# Patient Record
Sex: Female | Born: 1940 | Race: White | Hispanic: Yes | Marital: Married | Smoking: Never smoker
Health system: Southern US, Community
[De-identification: ages and names within clinical notes are randomized; demographics above are authoritative.]

## PROBLEM LIST (undated history)

## (undated) DIAGNOSIS — J45909 Unspecified asthma, uncomplicated: Secondary | ICD-10-CM

## (undated) HISTORY — DX: Unspecified asthma, uncomplicated: J45.909

## (undated) HISTORY — PX: BREAST EXCISIONAL BIOPSY: SUR124

---

## 2014-08-22 ENCOUNTER — Other Ambulatory Visit: Payer: Self-pay | Admitting: Cardiology

## 2014-08-22 ENCOUNTER — Ambulatory Visit
Admission: RE | Admit: 2014-08-22 | Discharge: 2014-08-22 | Disposition: A | Discharge status: Home / Self Care | Payer: Commercial Managed Care - HMO | Source: Ambulatory Visit | Attending: Cardiology | Admitting: Cardiology

## 2014-08-22 DIAGNOSIS — R0602 Shortness of breath: Secondary | ICD-10-CM

## 2015-06-14 ENCOUNTER — Other Ambulatory Visit: Payer: Self-pay | Admitting: Family Medicine

## 2015-06-14 DIAGNOSIS — R0989 Other specified symptoms and signs involving the circulatory and respiratory systems: Secondary | ICD-10-CM

## 2015-06-20 ENCOUNTER — Ambulatory Visit
Admission: RE | Admit: 2015-06-20 | Discharge: 2015-06-20 | Disposition: A | Discharge status: Home / Self Care | Payer: Commercial Managed Care - HMO | Source: Ambulatory Visit | Attending: Family Medicine | Admitting: Family Medicine

## 2015-06-20 DIAGNOSIS — R0989 Other specified symptoms and signs involving the circulatory and respiratory systems: Secondary | ICD-10-CM

## 2015-12-23 ENCOUNTER — Other Ambulatory Visit: Payer: Self-pay | Admitting: Family Medicine

## 2015-12-23 DIAGNOSIS — Z1231 Encounter for screening mammogram for malignant neoplasm of breast: Secondary | ICD-10-CM

## 2016-01-09 ENCOUNTER — Ambulatory Visit
Admission: RE | Admit: 2016-01-09 | Discharge: 2016-01-09 | Disposition: A | Discharge status: Home / Self Care | Payer: Commercial Managed Care - HMO | Source: Ambulatory Visit | Attending: Family Medicine | Admitting: Family Medicine

## 2016-01-09 DIAGNOSIS — Z1231 Encounter for screening mammogram for malignant neoplasm of breast: Secondary | ICD-10-CM

## 2016-01-10 ENCOUNTER — Other Ambulatory Visit: Payer: Self-pay | Admitting: Family Medicine

## 2016-01-10 DIAGNOSIS — D249 Benign neoplasm of unspecified breast: Secondary | ICD-10-CM

## 2016-01-15 ENCOUNTER — Ambulatory Visit
Admission: RE | Admit: 2016-01-15 | Discharge: 2016-01-15 | Disposition: A | Discharge status: Home / Self Care | Payer: Commercial Managed Care - HMO | Source: Ambulatory Visit | Attending: Family Medicine | Admitting: Family Medicine

## 2016-01-15 DIAGNOSIS — D249 Benign neoplasm of unspecified breast: Secondary | ICD-10-CM

## 2016-06-24 ENCOUNTER — Other Ambulatory Visit: Payer: Self-pay | Admitting: Family Medicine

## 2016-06-24 DIAGNOSIS — I739 Peripheral vascular disease, unspecified: Principal | ICD-10-CM

## 2016-06-24 DIAGNOSIS — I779 Disorder of arteries and arterioles, unspecified: Secondary | ICD-10-CM

## 2016-07-02 ENCOUNTER — Ambulatory Visit
Admission: RE | Admit: 2016-07-02 | Discharge: 2016-07-02 | Disposition: A | Discharge status: Home / Self Care | Payer: Commercial Managed Care - HMO | Source: Ambulatory Visit | Attending: Family Medicine | Admitting: Family Medicine

## 2016-07-02 DIAGNOSIS — I739 Peripheral vascular disease, unspecified: Principal | ICD-10-CM

## 2016-07-02 DIAGNOSIS — I779 Disorder of arteries and arterioles, unspecified: Secondary | ICD-10-CM

## 2016-12-28 DIAGNOSIS — I779 Disorder of arteries and arterioles, unspecified: Secondary | ICD-10-CM | POA: Diagnosis not present

## 2016-12-28 DIAGNOSIS — I129 Hypertensive chronic kidney disease with stage 1 through stage 4 chronic kidney disease, or unspecified chronic kidney disease: Secondary | ICD-10-CM | POA: Diagnosis not present

## 2016-12-28 DIAGNOSIS — J45909 Unspecified asthma, uncomplicated: Secondary | ICD-10-CM | POA: Diagnosis not present

## 2016-12-28 DIAGNOSIS — F322 Major depressive disorder, single episode, severe without psychotic features: Secondary | ICD-10-CM | POA: Diagnosis not present

## 2016-12-28 DIAGNOSIS — N183 Chronic kidney disease, stage 3 (moderate): Secondary | ICD-10-CM | POA: Diagnosis not present

## 2016-12-28 DIAGNOSIS — M1990 Unspecified osteoarthritis, unspecified site: Secondary | ICD-10-CM | POA: Diagnosis not present

## 2016-12-28 DIAGNOSIS — E1129 Type 2 diabetes mellitus with other diabetic kidney complication: Secondary | ICD-10-CM | POA: Diagnosis not present

## 2016-12-28 DIAGNOSIS — I208 Other forms of angina pectoris: Secondary | ICD-10-CM | POA: Diagnosis not present

## 2016-12-28 DIAGNOSIS — I2510 Atherosclerotic heart disease of native coronary artery without angina pectoris: Secondary | ICD-10-CM | POA: Diagnosis not present

## 2016-12-28 DIAGNOSIS — E7800 Pure hypercholesterolemia, unspecified: Secondary | ICD-10-CM | POA: Diagnosis not present

## 2016-12-28 DIAGNOSIS — R002 Palpitations: Secondary | ICD-10-CM | POA: Diagnosis not present

## 2016-12-28 DIAGNOSIS — Z23 Encounter for immunization: Secondary | ICD-10-CM | POA: Diagnosis not present

## 2017-01-20 ENCOUNTER — Encounter (INDEPENDENT_AMBULATORY_CARE_PROVIDER_SITE_OTHER): Payer: Self-pay

## 2017-01-20 ENCOUNTER — Encounter: Payer: Self-pay | Admitting: Allergy & Immunology

## 2017-01-20 ENCOUNTER — Ambulatory Visit (INDEPENDENT_AMBULATORY_CARE_PROVIDER_SITE_OTHER): Payer: PPO | Admitting: Allergy & Immunology

## 2017-01-20 VITALS — BP 132/70 | HR 80 | Temp 98.3°F | Resp 14 | Ht 58.5 in | Wt 151.0 lb

## 2017-01-20 DIAGNOSIS — J4540 Moderate persistent asthma, uncomplicated: Secondary | ICD-10-CM | POA: Diagnosis not present

## 2017-01-20 DIAGNOSIS — J3089 Other allergic rhinitis: Secondary | ICD-10-CM

## 2017-01-20 DIAGNOSIS — J310 Chronic rhinitis: Secondary | ICD-10-CM

## 2017-01-20 MED ORDER — BUDESONIDE-FORMOTEROL FUMARATE 160-4.5 MCG/ACT IN AERO: 2.0000 | INHALATION_SPRAY | Freq: Two times a day (BID) | RESPIRATORY_TRACT | 5 refills | Status: AC

## 2017-01-20 MED ORDER — AZELASTINE-FLUTICASONE 137-50 MCG/ACT NA SUSP: NASAL | 5 refills | Status: AC

## 2017-01-20 MED ORDER — EPINEPHRINE 0.3 MG/0.3ML IJ SOAJ: 0.3000 mg | Freq: Once | INTRAMUSCULAR | 1 refills | Status: AC

## 2017-01-20 MED ORDER — ALBUTEROL SULFATE HFA 108 (90 BASE) MCG/ACT IN AERS: 2.0000 | INHALATION_SPRAY | RESPIRATORY_TRACT | 1 refills | Status: AC | PRN

## 2017-01-20 MED ORDER — LEVOCETIRIZINE DIHYDROCHLORIDE 5 MG PO TABS: 5.0000 mg | ORAL_TABLET | Freq: Every evening | ORAL | 5 refills | Status: AC

## 2017-01-20 NOTE — Patient Instructions (Addendum)
1. Moderate persistent asthma, uncomplicated - Lung testing today was normal.  - Since you're having continued coughing despite albuterol, I recommended starting a daily inhaled medication. -  we will start Symbicort 160/4.5 two puffs in the morning and two puffs at night + Singulair 10mg  daily - Daily controller medication(s): Symbicort 160/4.5 two puffs in the morning and two puffs at night - Rescue medications: ProAir 4 puffs every 4-6 hours as needed - Asthma control goals:  * Full participation in all desired activities (may need albuterol before activity) * Albuterol use two time or less a week on average (not counting use with activity) * Cough interfering with sleep two time or less a month * Oral steroids no more than once a year * No hospitalizations  2. Chronic rhinitis - Testing today showed: positives to grasses, weeds, ragweed, molds, cat, dust mite, roach,  - Avoidance measures discussed.  - Stop Flonase and start Dymista 2 sprays per nostril 1-2 times daily. - Add Xyzal 5mg  daily.  - Call your insurance company to make sure that you are OK with the copays that would be required. - If you are fine with rhe copays, give [place] a call back and we can mix the allergy shots.  3. Return in about 3 months (around 04/19/2017).  Please inform us of any Emergency Department visits, hospitalizations, or changes in symptoms. Call us before going to the ED for breathing or allergy symptoms since we might be able to fit you in for a sick visit. Feel free to contact us anytime with any questions, problems, or concerns.  It was a pleasure to meet you today! Best wishes in the [place] Year!   Websites that have reliable patient information: 1. American Academy of Asthma, Allergy, and Immunology: www.aaaai.org 2. Food Allergy Research and Education (FARE): foodallergy.org 3. Mothers of Asthmatics: http://www.asthmacommunitynetwork.org 4. American College of Allergy, Asthma, and Immunology:  www.acaai.org  Reducing Pollen Exposure  The American Academy of Allergy, Asthma and Immunology suggests the following steps to reduce your exposure to pollen during allergy seasons.    1. Do not hang sheets or clothing out to dry; pollen may collect on these items. 2. Do not mow lawns or spend time around freshly cut grass; mowing stirs up pollen. 3. Keep windows closed at night.  Keep car windows closed while driving. 4. Minimize morning activities outdoors, a time when pollen counts are usually at their highest. 5. Stay indoors as much as possible when pollen counts or humidity is high and on windy days when pollen tends to remain in the air longer. 6. Use air conditioning when possible.  Many air conditioners have filters that trap the pollen spores. 7. Use a HEPA room air filter to remove pollen form the indoor air you breathe.  Control of Mold Allergen  Mold and fungi can grow on a variety of surfaces provided certain temperature and moisture conditions exist.  Outdoor molds grow on plants, decaying vegetation and soil.  The major outdoor mold, Alternaria and Cladosporium, are found in very high numbers during hot and dry conditions.  Generally, a late Summer - Fall peak is seen for common outdoor fungal spores.  Rain will temporarily lower outdoor mold spore count, but counts rise rapidly when the rainy period ends.  The most important indoor molds are Aspergillus and Penicillium.  Dark, humid and poorly ventilated basements are ideal sites for mold growth.  The next most common sites of mold growth are the bathroom and the kitchen.  Outdoor [employer] 1. Use air conditioning and keep windows closed 2. Avoid exposure to decaying vegetation. 3. Avoid leaf raking. 4. Avoid grain handling. 5. Consider wearing a face mask if working in moldy areas.  Indoor Mold Control 1. Maintain humidity below 50%. 2. Clean washable surfaces with 5% bleach solution. 3. Remove sources e.g.  contaminated carpets.  Control of Dog or Cat Allergen  Avoidance is the best way to manage a dog or cat allergy. If you have a dog or cat and are allergic to dog or cats, consider removing the dog or cat from the home. If you have a dog or cat but don't want to find it a new home, or if your family wants a pet even though someone in the household is allergic, here are some strategies that may help keep symptoms at bay:  1. Keep the pet out of your bedroom and restrict it to only a few rooms. Be advised that keeping the dog or cat in only one room will not limit the allergens to that room. 2. Don't pet, hug or kiss the dog or cat; if you do, wash your hands with soap and water. 3. High-efficiency particulate air (HEPA) cleaners run continuously in a bedroom or living room can reduce allergen levels over time. 4. Regular use of a high-efficiency vacuum cleaner or a central vacuum can reduce allergen levels. 5. Giving your dog or cat a bath at least once a week can reduce airborne allergen.  Control of House Dust Mite Allergen    House dust mites play a major role in allergic asthma and rhinitis.  They occur in environments with high humidity wherever human skin, the food for dust mites is found. High levels have been detected in dust obtained from mattresses, pillows, carpets, upholstered furniture, bed covers, clothes and soft toys.  The principal allergen of the house dust mite is found in its feces.  A gram of dust may contain 1,000 mites and 250,000 fecal particles.  Mite antigen is easily measured in the air during house cleaning activities.    1. Encase mattresses, including the box spring, and pillow, in an air tight cover.  Seal the zipper end of the encased mattresses with wide adhesive tape. 2. Wash the bedding in water of 130 degrees Farenheit weekly.  Avoid cotton comforters/quilts and flannel bedding: the most ideal bed covering is the dacron comforter. 3. Remove all upholstered  furniture from the bedroom. 4. Remove carpets, carpet padding, rugs, and non-washable window drapes from the bedroom.  Wash drapes weekly or use plastic window coverings. 5. Remove all non-washable stuffed toys from the bedroom.  Wash stuffed toys weekly. 6. Have the room cleaned frequently with a vacuum cleaner and a damp dust-mop.  The patient should not be in a room which is being cleaned and should wait 1 hour after cleaning before going into the room. 7. Close and seal all heating outlets in the bedroom.  Otherwise, the room will become filled with dust-laden air.  An electric heater can be used to heat the room. 8. Reduce indoor humidity to less than 50%.  Do not use a humidifier.  Control of Cockroach Allergen  Cockroach allergen has been identified as an important cause of acute attacks of asthma, especially in urban settings.  There are fifty-five species of cockroach that exist in the [place], however only three, the [place], [place] species produce allergen that can affect patients with Asthma.  Allergens can be obtained  from fecal particles, egg casings and secretions from cockroaches.    1. Remove food sources. 2. Reduce access to water. 3. Seal access and entry points. 4. Spray runways with 0.5-1% Diazinon or Chlorpyrifos 5. Blow boric acid power under stoves and refrigerator. 6. Place bait stations (hydramethylnon) at feeding sites.

## 2017-01-20 NOTE — Progress Notes (Signed)
NEW PATIENT  Date of Service/Encounter:  01/20/17  Referring provider: Simona Huh, MD   Assessment:   Moderate persistent asthma, uncomplicated  Chronic nonseasonal allergic rhinitis   Asthma Reportables:  Severity: moderate persistent  Risk: low Control: not well controlled  Seasonal Influenza Vaccine: yes    Plan/Recommendations:   1. Moderate persistent asthma, uncomplicated - Lung testing today was normal.  - Since you're having continued coughing despite albuterol, I recommended starting a daily inhaled medication. -  we will start Symbicort 160/4.5 two puffs in the morning and two puffs at night + Singulair 80m daily - Daily controller medication(s): Symbicort 160/4.5 two puffs in the morning and two puffs at night - Rescue medications: ProAir 4 puffs every 4-6 hours as needed - Asthma control goals:  * Full participation in all desired activities (may need albuterol before activity) * Albuterol use two time or less a week on average (not counting use with activity) * Cough interfering with sleep two time or less a month * Oral steroids no more than once a year * No hospitalizations  2. Chronic rhinitis - Testing today showed: positives to grasses, mold, cat - Avoidance measures discussed.  - Stop Flonase and start Dymista 2 sprays per nostril 1-2 times daily. - Add Xyzal 52mdaily.  - We can consider allergy shots if there is no improvement in this regimen.   3. Return in about 3 months (around 04/19/2017).   Subjective:   Megan Bright a [age].o. female presenting today for evaluation of  Chief Complaint  Patient presents with  . Allergies  . Asthma  . Cough  . Nasal Congestion    Megan Battagliaas a history of the following: There are no active problems to display for this patient.   History obtained from: chart review and patient via an interpreter.  Megan Bright was referred by EHSimona HuhMD.     Megan Bright  a [age].o. female presenting for cough and congestion. The cough has been going on for two months. The cough is productive and it is white. She has not tried taking anything for the phlegm. She was using albuterol which did not help. She did not get a CXR. She did have a fever initially but it cleared up. She did not take any prednisonefor this current illness. She has had a difficult time catching her breath since that time. Physical activity makes it worse. She has never been a smoker and has had no recent new exposures. She does carry the diagnosis of asthma. She is on Singulair and has been on that for a "long time". She did go to the hospital 4-5 years ago. She estimates that she needed prednisone around four years ago at the last time, otherwise, she has not needed prednisone in quite some time. It does not appear that she has ever been on any inhaled corticosteroids for her symptoms area   She is having a lot of nasal congestion. This has been ongoing for a period of two months. She has never been diagnosed with allergies previously. She does have itchy watery eyes. She has never taken antihistamines but she has used Flonase in the past. This does seem to help. She has never been allergy tested in the past.  She does have a history of high blood pressure but otherwise no chromic medical problems. [place]  Otherwise, there is no history of other atopic diseases, including  drug allergies, food allergies, stinging insect allergies, or urticaria. There is no significant infectious history. She does not remember the last time that she needed antibiotics. Vaccinations are up to date.     Past Medical History: There are no active problems to display for this patient.   Medication List:  Allergies as of 01/20/2017   Not on File     Medication List       Accurate as of 01/20/17 12:30 PM. Always  use your most recent med list.          albuterol 108 (90 Base) MCG/ACT inhaler Commonly known as:  PROVENTIL HFA;VENTOLIN HFA Inhale 2 puffs into the lungs every 4 (four) hours as needed for wheezing or shortness of breath.   atorvastatin 40 MG tablet Commonly known as:  LIPITOR Take 40 mg by mouth daily.   carvedilol 12.5 MG tablet Commonly known as:  COREG Take 12.5 mg by mouth 2 (two) times daily with a meal.   montelukast 10 MG tablet Commonly known as:  SINGULAIR Take 10 mg by mouth at bedtime.   sertraline 50 MG tablet Commonly known as:  ZOLOFT Take 50 mg by mouth daily.   valsartan-hydrochlorothiazide 320-25 MG tablet Commonly known as:  DIOVAN-HCT Take 1 tablet by mouth daily.       Birth History: non-contributory. Born at term without complications.   Developmental History: Megan Bright has met all milestones on time. She has required no speech therapy, occupational therapy, or physical therapy.   Past Surgical History: History reviewed. No pertinent surgical history.   Family History: Family History  Problem Relation Age of Onset  . Allergic rhinitis Neg Hx   . Angioedema Neg Hx   . Asthma Neg Hx   . Eczema Neg Hx   . Immunodeficiency Neg Hx   . Urticaria Neg Hx      Social History: Megan Bright lives at home with her husband of 14 years. 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 Her husband worked as a Games developer. She lives in a house. There is no mildew exposure. She has electric heat and central cooling. There are cats outside of the home. No dust mite covers on the bedding. There is no tobacco exposure.   Review of Systems: a 14-point review of systems is pertinent for what is mentioned in HPI.  Otherwise, all other systems were negative. Constitutional: negative other than that listed in the HPI Eyes: negative other than that listed in the HPI Ears, nose, mouth, throat, and face: negative other than that listed in the  HPI Respiratory: negative other than that listed in the HPI Cardiovascular: negative other than that listed in the HPI Gastrointestinal: negative other than that listed in the HPI Genitourinary: negative other than that listed in the HPI Integument: negative other than that listed in the HPI Hematologic: negative other than that listed in the HPI Musculoskeletal: negative other than that listed in the HPI Neurological: negative other than that listed in the HPI Allergy/Immunologic: negative other than that listed in the HPI    Objective:   Blood pressure 132/70, pulse 80, temperature 98.3 F (36.8 C), temperature source Oral, resp. rate 14, height 4' 10.5" (1.486 m), weight 151 lb (68.5 kg), SpO2 97 %. Body mass index is 31.02 kg/m.   Physical Exam:  General: Alert, interactive, in no acute distress. Cooperative with the exam. Very pleasant.  Eyes: No conjunctival  injection present on the right, No conjunctival injection present on the left, PERRL bilaterally, No discharge on the right, No discharge on the left, No Horner-Trantas dots present and allergic shiners present bilaterally Ears: Right TM pearly gray with normal light reflex, Left TM pearly gray with normal light reflex, Right TM intact without perforation and Left TM intact without perforation.  Nose/Throat: External nose within normal limits and septum midline, turbinates markedly edematous with clear discharge, post-pharynx erythematous with cobblestoning in the posterior oropharynx. Tonsils 2+ without exudates Neck: Supple without thyromegaly. Adenopathy: no enlarged lymph nodes appreciated in the anterior cervical, occipital, axillary, epitrochlear, inguinal, or popliteal regions Lungs: Clear to auscultation without wheezing, rhonchi or rales. No increased work of breathing. CV: Normal S1/S2, no murmurs. Capillary refill <2 seconds.  Abdomen: Nondistended, nontender. No guarding or rebound tenderness. Bowel sounds present  in all fields and hyperactive  Skin: Warm and dry, without lesions or rashes. Extremities:  No clubbing, cyanosis or edema. Neuro:   Grossly intact. No focal deficits appreciated. Responsive to questions.  Diagnostic studies:  Spirometry: results normal (FEV1: 1.27/73%, FVC: 1.82/82%, FEV1/FVC: 69%).    Spirometry consistent with normal pattern. We did administer an albuterol nebulizer treatment to see if there was an reversibility. However, there was no improvement.   Allergy Studies:   Indoor/Outdoor Percutaneous Adult Environmental Panel: positive to [place] blue grass, perennial rye grass, timothy grass, Aspergillus and cat. Otherwise negative with adequate controls.  Indoor/Outdoor Selected Intradermal Environmental Panel: positive to [place] grass, [place] grass, ragweed mix, weed mix, mold mix #1, mold mix #3, mold mix #4, cockroach and mite mix. Otherwise negative with adequate controls.      Salvatore Marvel, MD [facility]

## 2017-02-09 NOTE — Addendum Note (Signed)
Addended by: Valentina Shaggy on: 02/09/2017 10:23 PM   Modules accepted: Orders

## 2017-02-11 DIAGNOSIS — J301 Allergic rhinitis due to pollen: Secondary | ICD-10-CM | POA: Diagnosis not present

## 2017-02-12 DIAGNOSIS — J3089 Other allergic rhinitis: Secondary | ICD-10-CM | POA: Diagnosis not present

## 2017-02-12 NOTE — Addendum Note (Signed)
Addended by: Valentina Shaggy on: 02/12/2017 12:00 PM   Modules accepted: Orders

## 2017-02-17 ENCOUNTER — Ambulatory Visit (INDEPENDENT_AMBULATORY_CARE_PROVIDER_SITE_OTHER): Payer: PPO

## 2017-02-17 DIAGNOSIS — J3089 Other allergic rhinitis: Secondary | ICD-10-CM | POA: Diagnosis not present

## 2017-02-17 MED ORDER — EPINEPHRINE 0.3 MG/0.3ML IJ SOAJ: 0.3000 mg | Freq: Once | INTRAMUSCULAR | 1 refills | Status: AC | PRN

## 2017-02-17 NOTE — Progress Notes (Signed)
Immunotherapy   Patient Details  Name: Megan Bright MRN: [id number] Date of Birth: February 27, 1941  02/17/2017  [place] [facility] started injections for  MOLD/CR/POLLENS/DM/CAT Following schedule: B  Frequency:2 times per week Epi-Pen:Prescription for Epi-Pen given Consent signed and patient instructions given.   Rosalio Loud 02/17/2017, 1:54 PM

## 2017-02-22 ENCOUNTER — Ambulatory Visit (INDEPENDENT_AMBULATORY_CARE_PROVIDER_SITE_OTHER): Payer: PPO

## 2017-02-22 DIAGNOSIS — J309 Allergic rhinitis, unspecified: Secondary | ICD-10-CM

## 2017-02-24 ENCOUNTER — Ambulatory Visit (INDEPENDENT_AMBULATORY_CARE_PROVIDER_SITE_OTHER): Payer: PPO

## 2017-02-24 DIAGNOSIS — J309 Allergic rhinitis, unspecified: Secondary | ICD-10-CM

## 2017-03-02 ENCOUNTER — Ambulatory Visit (INDEPENDENT_AMBULATORY_CARE_PROVIDER_SITE_OTHER): Payer: PPO | Admitting: *Deleted

## 2017-03-02 DIAGNOSIS — J309 Allergic rhinitis, unspecified: Secondary | ICD-10-CM | POA: Diagnosis not present

## 2017-03-04 ENCOUNTER — Ambulatory Visit (INDEPENDENT_AMBULATORY_CARE_PROVIDER_SITE_OTHER): Payer: PPO | Admitting: *Deleted

## 2017-03-04 DIAGNOSIS — J309 Allergic rhinitis, unspecified: Secondary | ICD-10-CM

## 2017-03-08 ENCOUNTER — Ambulatory Visit (INDEPENDENT_AMBULATORY_CARE_PROVIDER_SITE_OTHER): Payer: PPO | Admitting: *Deleted

## 2017-03-08 DIAGNOSIS — J309 Allergic rhinitis, unspecified: Secondary | ICD-10-CM | POA: Diagnosis not present

## 2017-03-12 ENCOUNTER — Ambulatory Visit (INDEPENDENT_AMBULATORY_CARE_PROVIDER_SITE_OTHER): Payer: PPO | Admitting: *Deleted

## 2017-03-12 DIAGNOSIS — J309 Allergic rhinitis, unspecified: Secondary | ICD-10-CM | POA: Diagnosis not present

## 2017-03-16 ENCOUNTER — Ambulatory Visit (INDEPENDENT_AMBULATORY_CARE_PROVIDER_SITE_OTHER): Payer: PPO | Admitting: *Deleted

## 2017-03-16 DIAGNOSIS — J309 Allergic rhinitis, unspecified: Secondary | ICD-10-CM

## 2017-03-23 ENCOUNTER — Ambulatory Visit (INDEPENDENT_AMBULATORY_CARE_PROVIDER_SITE_OTHER): Payer: PPO | Admitting: *Deleted

## 2017-03-23 DIAGNOSIS — J309 Allergic rhinitis, unspecified: Secondary | ICD-10-CM | POA: Diagnosis not present

## 2017-03-30 ENCOUNTER — Ambulatory Visit (INDEPENDENT_AMBULATORY_CARE_PROVIDER_SITE_OTHER): Payer: PPO | Admitting: *Deleted

## 2017-03-30 DIAGNOSIS — J309 Allergic rhinitis, unspecified: Secondary | ICD-10-CM | POA: Diagnosis not present

## 2017-04-02 ENCOUNTER — Ambulatory Visit (INDEPENDENT_AMBULATORY_CARE_PROVIDER_SITE_OTHER): Payer: PPO

## 2017-04-02 DIAGNOSIS — J309 Allergic rhinitis, unspecified: Secondary | ICD-10-CM

## 2017-04-06 ENCOUNTER — Ambulatory Visit (INDEPENDENT_AMBULATORY_CARE_PROVIDER_SITE_OTHER): Payer: PPO | Admitting: *Deleted

## 2017-04-06 DIAGNOSIS — J309 Allergic rhinitis, unspecified: Secondary | ICD-10-CM | POA: Diagnosis not present

## 2017-04-12 ENCOUNTER — Ambulatory Visit (INDEPENDENT_AMBULATORY_CARE_PROVIDER_SITE_OTHER): Payer: PPO

## 2017-04-12 DIAGNOSIS — J309 Allergic rhinitis, unspecified: Secondary | ICD-10-CM | POA: Diagnosis not present

## 2017-04-19 ENCOUNTER — Encounter (INDEPENDENT_AMBULATORY_CARE_PROVIDER_SITE_OTHER): Payer: Self-pay

## 2017-04-19 ENCOUNTER — Encounter (INDEPENDENT_AMBULATORY_CARE_PROVIDER_SITE_OTHER): Payer: PPO | Admitting: Allergy & Immunology

## 2017-04-19 ENCOUNTER — Ambulatory Visit (INDEPENDENT_AMBULATORY_CARE_PROVIDER_SITE_OTHER): Payer: PPO | Admitting: Allergy & Immunology

## 2017-04-19 ENCOUNTER — Encounter: Payer: Self-pay | Admitting: Allergy & Immunology

## 2017-04-19 VITALS — BP 130/80 | HR 75 | Temp 98.0°F | Resp 16

## 2017-04-19 DIAGNOSIS — J4540 Moderate persistent asthma, uncomplicated: Secondary | ICD-10-CM | POA: Diagnosis not present

## 2017-04-19 DIAGNOSIS — J3089 Other allergic rhinitis: Secondary | ICD-10-CM

## 2017-04-19 NOTE — Patient Instructions (Addendum)
1. Moderate persistent asthma, uncomplicated - Lung testing today was normal and the Symbicort seems to have helped your cough tremendously.  - We will not make any changes today.  - Daily controller medication(s): Symbicort 160/4.5 two puffs in the morning and two puffs at night - Rescue medications: ProAir 4 puffs every 4-6 hours as needed - Asthma control goals:  * Full participation in all desired activities (may need albuterol before activity) * Albuterol use two time or less a week on average (not counting use with activity) * Cough interfering with sleep two time or less a month * Oral steroids no more than once a year * No hospitalizations  2. Chronic allergic rhinitis (grasses, weeds, ragweed, molds, cat, dust mite, roach) - Continue with Dymista 2 sprays per nostril 1-2 times daily. - Continue with Xyzal 5mg  daily.  - Continue with allergy shots at the current schedule.   3. Return in about 6 months (around 10/20/2017).  Please inform us of any Emergency Department visits, hospitalizations, or changes in symptoms. Call us before going to the ED for breathing or allergy symptoms since we might be able to fit you in for a sick visit. Feel free to contact us anytime with any questions, problems, or concerns.  It was a pleasure to see you and your family again today! Happy spring!   Websites that have reliable patient information: 1. American Academy of Asthma, Allergy, and Immunology: www.aaaai.org 2. Food Allergy Research and Education (FARE): foodallergy.org 3. Mothers of Asthmatics: http://www.asthmacommunitynetwork.org 4. American College of Allergy, Asthma, and Immunology: www.acaai.org

## 2017-04-19 NOTE — Progress Notes (Signed)
FOLLOW UP  Date of Service/Encounter:  04/19/17   Assessment:   Chronic nonseasonal allergic rhinitis due to fungal spores  Moderate persistent asthma, uncomplicated   Asthma Reportables:  Severity: moderate persistent  Risk: low Control: well controlled   Plan/Recommendations:   1. Moderate persistent asthma, uncomplicated - well controlled with Symbicort - Lung testing today was normal and the Symbicort seems to have helped your cough tremendously.  - We will not make any changes today.  - Daily controller medication(s): Symbicort 160/4.5 two puffs in the morning and two puffs at night - Rescue medications: ProAir 4 puffs every 4-6 hours as needed - Asthma control goals:  * Full participation in all desired activities (may need albuterol before activity) * Albuterol use two time or less a week on average (not counting use with activity) * Cough interfering with sleep two time or less a month * Oral steroids no more than once a year * No hospitalizations  2. Chronic allergic rhinitis (grasses, weeds, ragweed, molds, cat, dust mite, roach) - Continue with Dymista 2 sprays per nostril 1-2 times daily. - Continue with Xyzal 5mg  daily.  - Continue with allergy shots at the current schedule.   3. Return in about 6 months (around 10/20/2017).  Subjective:   Megan Bright is a 76 y.o. female presenting today for follow up of  Chief Complaint  Patient presents with  . Asthma    Megan Bright has a history of the following: Patient Active Problem List   Diagnosis Date Noted  . Chronic nonseasonal allergic rhinitis due to fungal spores 04/19/2017  . Moderate persistent asthma, uncomplicated 19/14/7829    History obtained from: chart review and patient.  Megan Bright was referred by Gaynelle Arabian, MD.     Megan Bright is a 76 y.o. female presenting for a follow up visit. She was last seen as a new patient in February 2018. At that time, we started her on  Symbicort 160/4.5 g 2 puffs in the morning 2 puffs at night as well as Singulair 10 mg daily. She had testing that showed positives to grasses, molds, and cat. We started Dymista 2 sprays per nostril 1-2 times daily and added Xyzal 5 mg daily. She has since started allergy shots and has tolerated these well.  Since last visit, she has done well. She remains on the Symbicort but is not using the Singulair at all. [name]'s asthma has been well controlled. She has not required rescue medication, experienced nocturnal awakenings due to lower respiratory symptoms, nor have activities of daily living been limited. She had not required any ED visits or UC visits. She has not needed any prednisone whatsoever. She is very happy with how well her breathing is doing on the Symbicort. It is difficult to ascertain how often she is needing her rescue inhaler, it at all. However she is not coughing during this visit as she was during the prior visit.    Allergic rhinitis symptoms are well-controlled. She is not using her antihistamine at all but she does remain on her nasal sprays. Allergy shots are going well without any evidence of localized reactions. She feels that the shots are helping with her symptoms, espeicallywhen you compare this year to previous years.   Otherwise, there have been no changes to her past medical history, surgical history, family history, or social history.    Review of Systems: a 14-point review of systems is pertinent for what is mentioned in HPI.  Otherwise, all other systems were  negative. Constitutional: negative other than that listed in the HPI Eyes: negative other than that listed in the HPI Ears, nose, mouth, throat, and face: negative other than that listed in the HPI Respiratory: negative other than that listed in the HPI Cardiovascular: negative other than that listed in the HPI Gastrointestinal: negative other than that listed in the HPI Genitourinary: negative other than  that listed in the HPI Integument: negative other than that listed in the HPI Hematologic: negative other than that listed in the HPI Musculoskeletal: negative other than that listed in the HPI Neurological: negative other than that listed in the HPI Allergy/Immunologic: negative other than that listed in the HPI    Objective:   Blood pressure 130/80, pulse 75, temperature 98 F (36.7 C), temperature source Oral, resp. rate 16, SpO2 98 %. There is no height or weight on file to calculate BMI.   Physical Exam:  General: Alert, interactive, in no acute distress. Pleasant smiling, appreciative female.  Eyes: No conjunctival injection present on the right, No conjunctival injection present on the left, PERRL bilaterally, No discharge on the right, No discharge on the left and No Horner-Trantas dots present Ears: Right TM pearly gray with normal light reflex, Left TM pearly gray with normal light reflex, Right TM intact without perforation and Left TM intact without perforation.  Nose/Throat: External nose within normal limits and septum midline, turbinates edematous and pale with clear discharge, post-pharynx markedly erythematous without cobblestoning in the posterior oropharynx. Tonsils 2+ without exudates Neck: Supple without thyromegaly. Lungs: Clear to auscultation without wheezing, rhonchi or rales. No increased work of breathing. CV: Normal S1/S2, no murmurs. Capillary refill <2 seconds.  Skin: Warm and dry, without lesions or rashes. Neuro:   Grossly intact. No focal deficits appreciated. Responsive to questions.   Diagnostic studies:   Spirometry: results normal (FEV1: 1.19/68%, FVC: 1.56/70%, FEV1/FVC: 76%).    Spirometry consistent with normal pattern.  Allergy Studies: none    Salvatore Marvel, MD [facility]

## 2017-04-21 NOTE — Addendum Note (Signed)
Addended by: Gara Kroner L on: 04/21/2017 03:14 PM   Modules accepted: Orders

## 2017-05-03 ENCOUNTER — Ambulatory Visit (INDEPENDENT_AMBULATORY_CARE_PROVIDER_SITE_OTHER): Payer: PPO

## 2017-05-03 DIAGNOSIS — J309 Allergic rhinitis, unspecified: Secondary | ICD-10-CM

## 2017-05-07 ENCOUNTER — Ambulatory Visit (INDEPENDENT_AMBULATORY_CARE_PROVIDER_SITE_OTHER): Payer: PPO

## 2017-05-07 DIAGNOSIS — J309 Allergic rhinitis, unspecified: Secondary | ICD-10-CM | POA: Diagnosis not present

## 2017-05-11 ENCOUNTER — Ambulatory Visit (INDEPENDENT_AMBULATORY_CARE_PROVIDER_SITE_OTHER): Payer: PPO | Admitting: *Deleted

## 2017-05-11 DIAGNOSIS — J309 Allergic rhinitis, unspecified: Secondary | ICD-10-CM | POA: Diagnosis not present

## 2017-05-14 ENCOUNTER — Ambulatory Visit (INDEPENDENT_AMBULATORY_CARE_PROVIDER_SITE_OTHER): Payer: PPO

## 2017-05-14 DIAGNOSIS — J309 Allergic rhinitis, unspecified: Secondary | ICD-10-CM

## 2017-05-18 ENCOUNTER — Ambulatory Visit (INDEPENDENT_AMBULATORY_CARE_PROVIDER_SITE_OTHER): Payer: PPO | Admitting: *Deleted

## 2017-05-18 DIAGNOSIS — J309 Allergic rhinitis, unspecified: Secondary | ICD-10-CM

## 2017-05-28 ENCOUNTER — Ambulatory Visit (INDEPENDENT_AMBULATORY_CARE_PROVIDER_SITE_OTHER): Payer: PPO

## 2017-05-28 DIAGNOSIS — J309 Allergic rhinitis, unspecified: Secondary | ICD-10-CM | POA: Diagnosis not present

## 2017-06-04 ENCOUNTER — Ambulatory Visit (INDEPENDENT_AMBULATORY_CARE_PROVIDER_SITE_OTHER): Payer: PPO

## 2017-06-04 DIAGNOSIS — J309 Allergic rhinitis, unspecified: Secondary | ICD-10-CM

## 2017-06-09 ENCOUNTER — Ambulatory Visit (INDEPENDENT_AMBULATORY_CARE_PROVIDER_SITE_OTHER): Payer: PPO | Admitting: *Deleted

## 2017-06-09 DIAGNOSIS — J309 Allergic rhinitis, unspecified: Secondary | ICD-10-CM

## 2017-06-22 ENCOUNTER — Ambulatory Visit (INDEPENDENT_AMBULATORY_CARE_PROVIDER_SITE_OTHER): Payer: PPO | Admitting: *Deleted

## 2017-06-22 DIAGNOSIS — J309 Allergic rhinitis, unspecified: Secondary | ICD-10-CM | POA: Diagnosis not present

## 2017-06-30 ENCOUNTER — Ambulatory Visit (INDEPENDENT_AMBULATORY_CARE_PROVIDER_SITE_OTHER): Payer: PPO

## 2017-06-30 DIAGNOSIS — J309 Allergic rhinitis, unspecified: Secondary | ICD-10-CM

## 2017-07-07 ENCOUNTER — Other Ambulatory Visit: Payer: Self-pay | Admitting: Family Medicine

## 2017-07-07 DIAGNOSIS — R002 Palpitations: Secondary | ICD-10-CM | POA: Diagnosis not present

## 2017-07-07 DIAGNOSIS — Z23 Encounter for immunization: Secondary | ICD-10-CM | POA: Diagnosis not present

## 2017-07-07 DIAGNOSIS — I779 Disorder of arteries and arterioles, unspecified: Secondary | ICD-10-CM

## 2017-07-07 DIAGNOSIS — I129 Hypertensive chronic kidney disease with stage 1 through stage 4 chronic kidney disease, or unspecified chronic kidney disease: Secondary | ICD-10-CM | POA: Diagnosis not present

## 2017-07-07 DIAGNOSIS — I739 Peripheral vascular disease, unspecified: Principal | ICD-10-CM

## 2017-07-07 DIAGNOSIS — I2510 Atherosclerotic heart disease of native coronary artery without angina pectoris: Secondary | ICD-10-CM | POA: Diagnosis not present

## 2017-07-07 DIAGNOSIS — E7800 Pure hypercholesterolemia, unspecified: Secondary | ICD-10-CM | POA: Diagnosis not present

## 2017-07-07 DIAGNOSIS — Z1231 Encounter for screening mammogram for malignant neoplasm of breast: Secondary | ICD-10-CM

## 2017-07-07 DIAGNOSIS — F322 Major depressive disorder, single episode, severe without psychotic features: Secondary | ICD-10-CM | POA: Diagnosis not present

## 2017-07-07 DIAGNOSIS — E1129 Type 2 diabetes mellitus with other diabetic kidney complication: Secondary | ICD-10-CM | POA: Diagnosis not present

## 2017-07-07 DIAGNOSIS — N183 Chronic kidney disease, stage 3 (moderate): Secondary | ICD-10-CM | POA: Diagnosis not present

## 2017-07-07 DIAGNOSIS — I208 Other forms of angina pectoris: Secondary | ICD-10-CM | POA: Diagnosis not present

## 2017-07-07 DIAGNOSIS — M1990 Unspecified osteoarthritis, unspecified site: Secondary | ICD-10-CM | POA: Diagnosis not present

## 2017-07-07 DIAGNOSIS — J45909 Unspecified asthma, uncomplicated: Secondary | ICD-10-CM | POA: Diagnosis not present

## 2017-07-09 ENCOUNTER — Ambulatory Visit (INDEPENDENT_AMBULATORY_CARE_PROVIDER_SITE_OTHER): Payer: PPO

## 2017-07-09 DIAGNOSIS — J309 Allergic rhinitis, unspecified: Secondary | ICD-10-CM

## 2017-07-12 ENCOUNTER — Ambulatory Visit
Admission: RE | Admit: 2017-07-12 | Discharge: 2017-07-12 | Disposition: A | Discharge status: Home / Self Care | Payer: PPO | Source: Ambulatory Visit | Attending: Family Medicine | Admitting: Family Medicine

## 2017-07-12 DIAGNOSIS — I6523 Occlusion and stenosis of bilateral carotid arteries: Secondary | ICD-10-CM | POA: Diagnosis not present

## 2017-07-12 DIAGNOSIS — I779 Disorder of arteries and arterioles, unspecified: Secondary | ICD-10-CM

## 2017-07-12 DIAGNOSIS — I739 Peripheral vascular disease, unspecified: Principal | ICD-10-CM

## 2017-07-14 ENCOUNTER — Ambulatory Visit
Admission: RE | Admit: 2017-07-14 | Discharge: 2017-07-14 | Disposition: A | Discharge status: Home / Self Care | Payer: PPO | Source: Ambulatory Visit | Attending: Family Medicine | Admitting: Family Medicine

## 2017-07-14 DIAGNOSIS — Z1231 Encounter for screening mammogram for malignant neoplasm of breast: Secondary | ICD-10-CM

## 2017-07-15 DIAGNOSIS — J301 Allergic rhinitis due to pollen: Secondary | ICD-10-CM | POA: Diagnosis not present

## 2017-07-15 NOTE — Progress Notes (Signed)
VIALS EXP 07-16-18 

## 2017-07-16 DIAGNOSIS — J3089 Other allergic rhinitis: Secondary | ICD-10-CM | POA: Diagnosis not present

## 2017-07-19 ENCOUNTER — Ambulatory Visit (INDEPENDENT_AMBULATORY_CARE_PROVIDER_SITE_OTHER): Payer: PPO | Admitting: *Deleted

## 2017-07-19 DIAGNOSIS — J309 Allergic rhinitis, unspecified: Secondary | ICD-10-CM | POA: Diagnosis not present

## 2017-07-29 ENCOUNTER — Ambulatory Visit (INDEPENDENT_AMBULATORY_CARE_PROVIDER_SITE_OTHER): Payer: PPO | Admitting: *Deleted

## 2017-07-29 DIAGNOSIS — J309 Allergic rhinitis, unspecified: Secondary | ICD-10-CM | POA: Diagnosis not present

## 2017-08-05 ENCOUNTER — Ambulatory Visit (INDEPENDENT_AMBULATORY_CARE_PROVIDER_SITE_OTHER): Payer: PPO | Admitting: *Deleted

## 2017-08-05 DIAGNOSIS — J309 Allergic rhinitis, unspecified: Secondary | ICD-10-CM

## 2017-08-10 ENCOUNTER — Ambulatory Visit (INDEPENDENT_AMBULATORY_CARE_PROVIDER_SITE_OTHER): Payer: PPO | Admitting: *Deleted

## 2017-08-10 DIAGNOSIS — J309 Allergic rhinitis, unspecified: Secondary | ICD-10-CM | POA: Diagnosis not present

## 2017-08-17 DIAGNOSIS — M1712 Unilateral primary osteoarthritis, left knee: Secondary | ICD-10-CM | POA: Diagnosis not present

## 2017-08-17 DIAGNOSIS — M1711 Unilateral primary osteoarthritis, right knee: Secondary | ICD-10-CM | POA: Diagnosis not present

## 2017-08-19 ENCOUNTER — Ambulatory Visit (INDEPENDENT_AMBULATORY_CARE_PROVIDER_SITE_OTHER): Payer: PPO

## 2017-08-19 DIAGNOSIS — J309 Allergic rhinitis, unspecified: Secondary | ICD-10-CM

## 2017-08-26 ENCOUNTER — Ambulatory Visit (INDEPENDENT_AMBULATORY_CARE_PROVIDER_SITE_OTHER): Payer: PPO

## 2017-08-26 DIAGNOSIS — J309 Allergic rhinitis, unspecified: Secondary | ICD-10-CM | POA: Diagnosis not present

## 2017-09-01 ENCOUNTER — Ambulatory Visit (INDEPENDENT_AMBULATORY_CARE_PROVIDER_SITE_OTHER): Payer: PPO

## 2017-09-01 DIAGNOSIS — J309 Allergic rhinitis, unspecified: Secondary | ICD-10-CM

## 2017-09-17 ENCOUNTER — Ambulatory Visit (INDEPENDENT_AMBULATORY_CARE_PROVIDER_SITE_OTHER): Payer: PPO

## 2017-09-17 DIAGNOSIS — J309 Allergic rhinitis, unspecified: Secondary | ICD-10-CM | POA: Diagnosis not present

## 2017-09-28 ENCOUNTER — Ambulatory Visit (INDEPENDENT_AMBULATORY_CARE_PROVIDER_SITE_OTHER): Payer: PPO | Admitting: *Deleted

## 2017-09-28 DIAGNOSIS — J309 Allergic rhinitis, unspecified: Secondary | ICD-10-CM

## 2017-10-15 ENCOUNTER — Ambulatory Visit (INDEPENDENT_AMBULATORY_CARE_PROVIDER_SITE_OTHER): Payer: PPO

## 2017-10-15 DIAGNOSIS — J309 Allergic rhinitis, unspecified: Secondary | ICD-10-CM | POA: Diagnosis not present

## 2017-10-20 ENCOUNTER — Ambulatory Visit (INDEPENDENT_AMBULATORY_CARE_PROVIDER_SITE_OTHER): Payer: PPO

## 2017-10-20 DIAGNOSIS — J309 Allergic rhinitis, unspecified: Secondary | ICD-10-CM | POA: Diagnosis not present

## 2017-11-05 ENCOUNTER — Ambulatory Visit (INDEPENDENT_AMBULATORY_CARE_PROVIDER_SITE_OTHER): Payer: PPO

## 2017-11-05 DIAGNOSIS — J309 Allergic rhinitis, unspecified: Secondary | ICD-10-CM | POA: Diagnosis not present

## 2017-11-11 ENCOUNTER — Ambulatory Visit (INDEPENDENT_AMBULATORY_CARE_PROVIDER_SITE_OTHER): Payer: PPO | Admitting: *Deleted

## 2017-11-11 DIAGNOSIS — J309 Allergic rhinitis, unspecified: Secondary | ICD-10-CM | POA: Diagnosis not present

## 2017-11-17 ENCOUNTER — Encounter: Payer: Self-pay | Admitting: *Deleted

## 2017-11-17 NOTE — Progress Notes (Signed)
VIALS MADE. EXP: 11-17-18. HV 

## 2017-11-24 DIAGNOSIS — J301 Allergic rhinitis due to pollen: Secondary | ICD-10-CM | POA: Diagnosis not present

## 2017-11-25 DIAGNOSIS — J3089 Other allergic rhinitis: Secondary | ICD-10-CM | POA: Diagnosis not present

## 2017-12-23 IMAGING — US US CAROTID DUPLEX BILAT
1 series · 13 of 24 positions shown · non-contrast
Comparison: 06/20/2015

CLINICAL DATA: Carotid atherosclerosis, hypertension,
hyperlipidemia and diabetes

EXAM:
BILATERAL CAROTID DUPLEX ULTRASOUND
TECHNIQUE: Gray scale imaging, color Doppler and duplex ultrasound were
performed of bilateral carotid and vertebral arteries in the neck.

[Series 1: us carotid duplex bilat · 0.08mm/px · 13 of 83 slices shown]
[im 1/83]
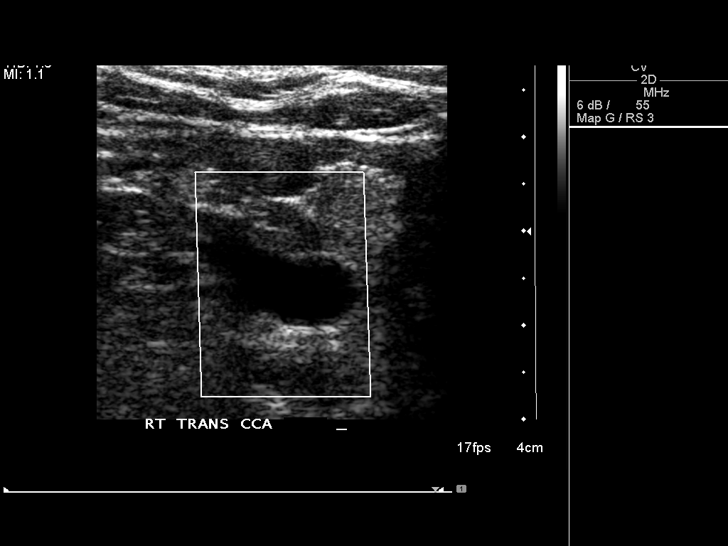
[im 8/83]
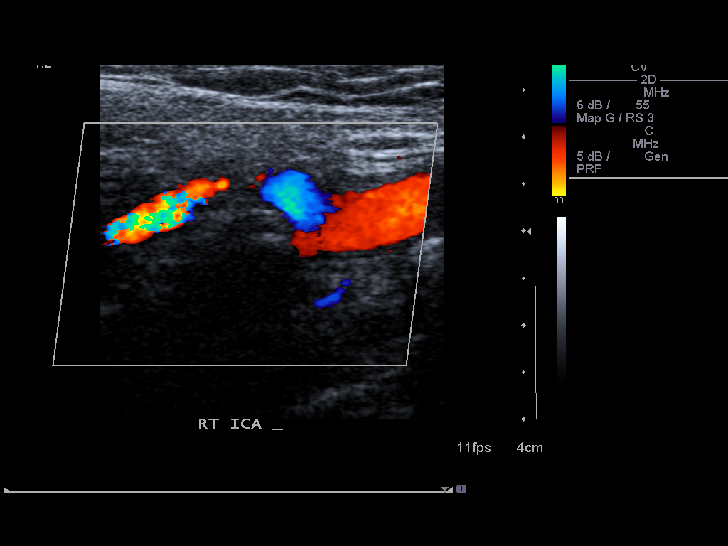
[im 15/83]
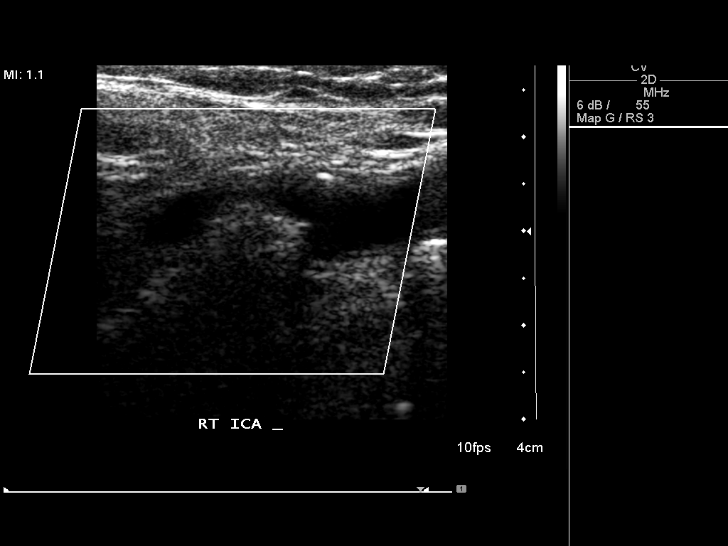
[im 22/83]
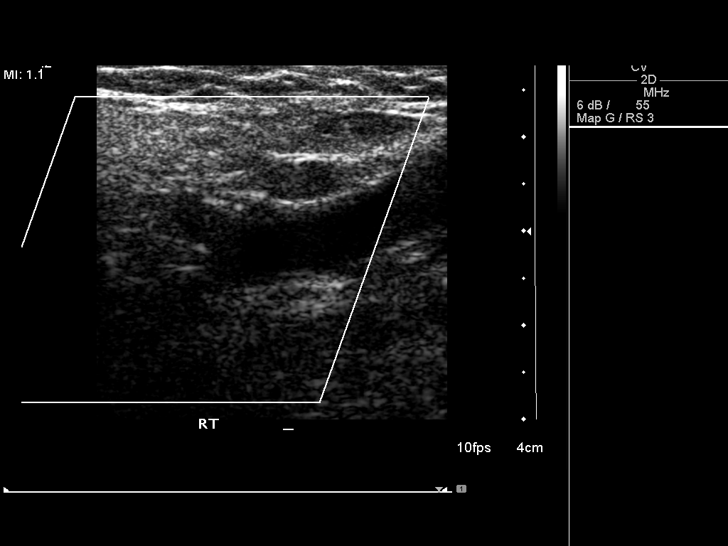
[im 29/83]
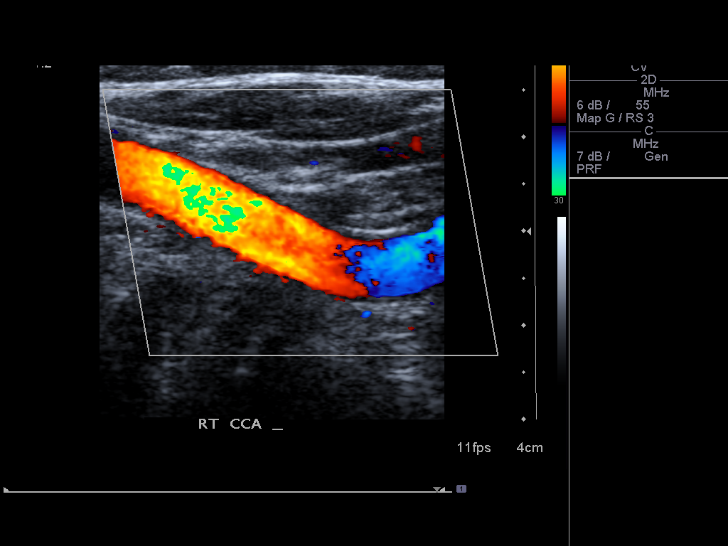
[im 36/83]
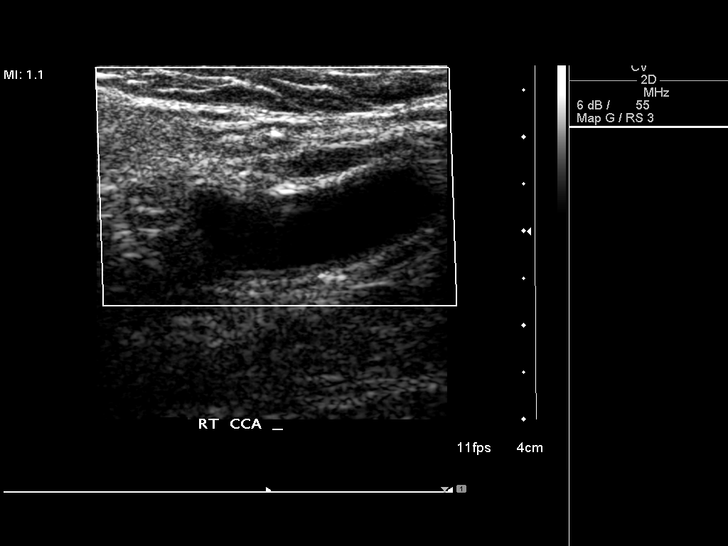
[im 43/83]
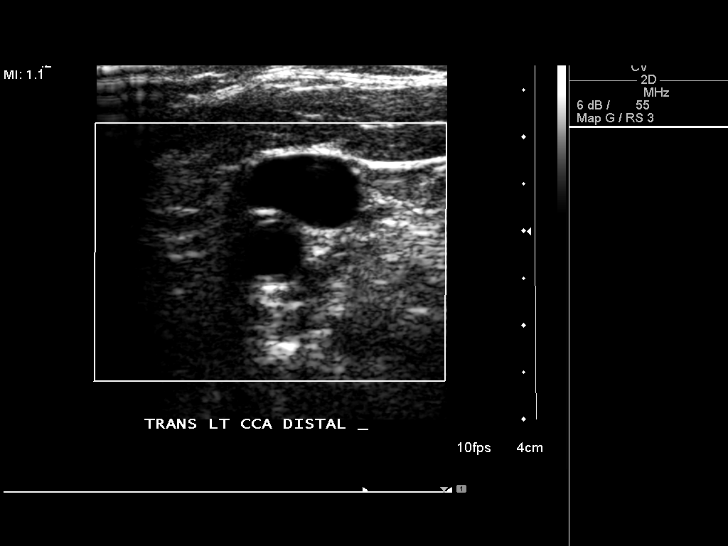
[im 47/83]
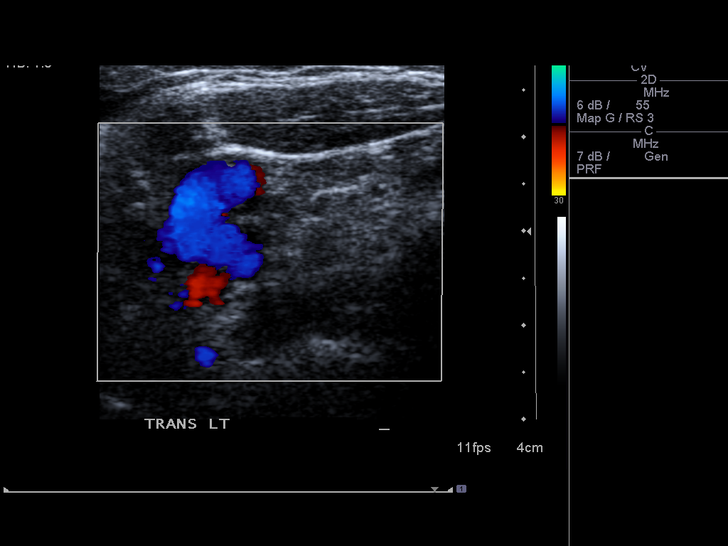
[im 54/83]
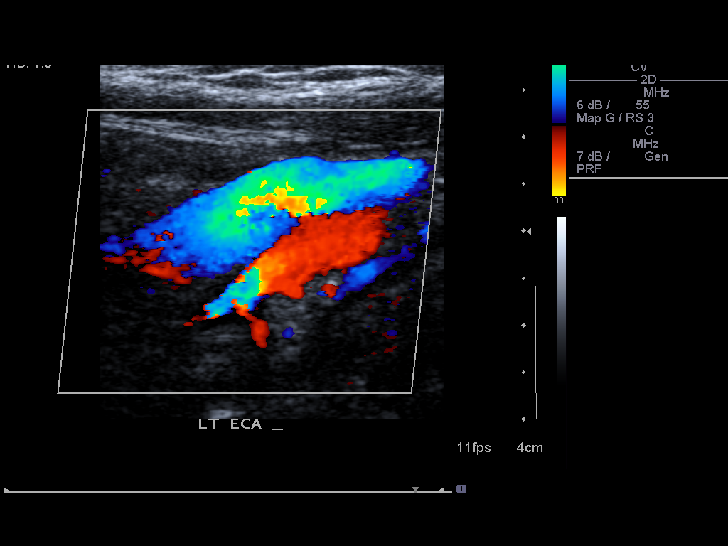
[im 61/83]
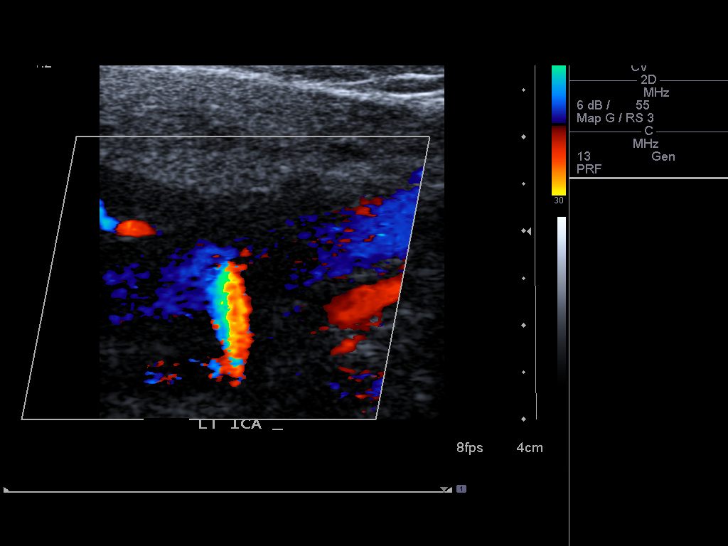
[im 68/83]
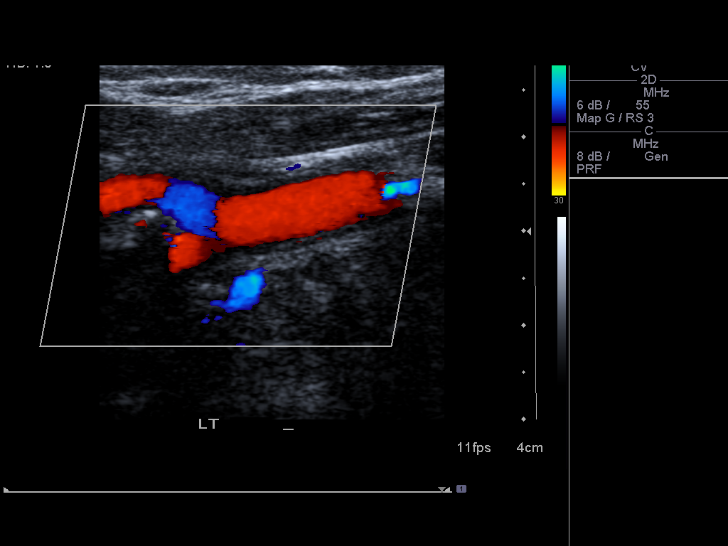
[im 75/83]
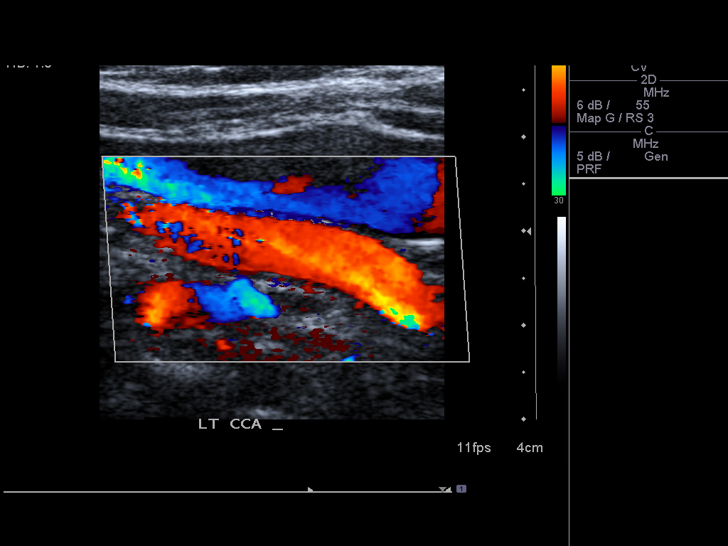
[im 83/83]
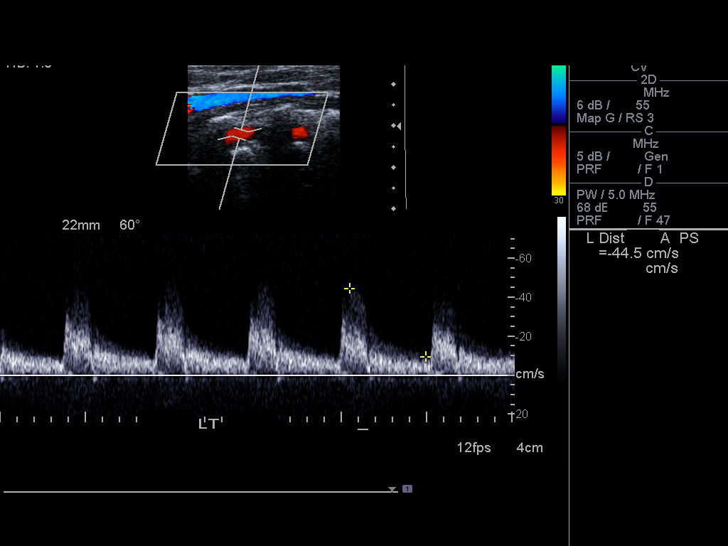

[13 of 24 positions shown; findings below may reference images not displayed]

FINDINGS: Criteria: Quantification of carotid stenosis is based on velocity
parameters that correlate the residual internal carotid diameter
with NASCET-based stenosis levels, using the diameter of the distal
internal carotid lumen as the denominator for stenosis measurement.

The following velocity measurements were obtained:

RIGHT

ICA:  132/35 cm/sec

CCA:  97/17 cm/sec

SYSTOLIC ICA/CCA RATIO:

DIASTOLIC ICA/CCA RATIO:

ECA:  116 cm/sec

LEFT

ICA:  148/34 cm/sec

CCA:  108/13 cm/sec

SYSTOLIC ICA/CCA RATIO:

DIASTOLIC ICA/CCA RATIO:

ECA:  126 cm/sec

RIGHT CAROTID ARTERY: Minor echogenic shadowing plaque formation. No
hemodynamically significant right ICA stenosis, velocity elevation,
or turbulent flow. Degree of narrowing less than 50%.

RIGHT VERTEBRAL ARTERY:  Antegrade

LEFT CAROTID ARTERY: Similar scattered minor echogenic plaque
formation. Tortuous distal ICA. This tortuosity appears to account
for slight distal ICA velocity elevation as above. No
hemodynamically significant left ICA stenosis, velocity elevation,
or turbulent flow.

LEFT VERTEBRAL ARTERY:  Antegrade
IMPRESSION: Minor bilateral carotid atherosclerosis. Tortuous distal left ICA
noted.

No significant ICA stenosis detected. Degree of narrowing less than
50% bilaterally.

Patent antegrade vertebral flow bilaterally

## 2019-03-29 IMAGING — US US CAROTID DUPLEX BILAT
1 series · 13 of 24 positions shown · non-contrast
Comparison: 07/02/2016

CLINICAL DATA: Bilateral carotid atherosclerosis. History of
hypertension, hyperlipidemia and diabetes.

EXAM:
BILATERAL CAROTID DUPLEX ULTRASOUND
TECHNIQUE: Gray scale imaging, color Doppler and duplex ultrasound were
performed of bilateral carotid and vertebral arteries in the neck.

[Series 1: us carotid duplex bilat · 0.06mm/px · 13 of 46 slices shown]
[im 1/46]
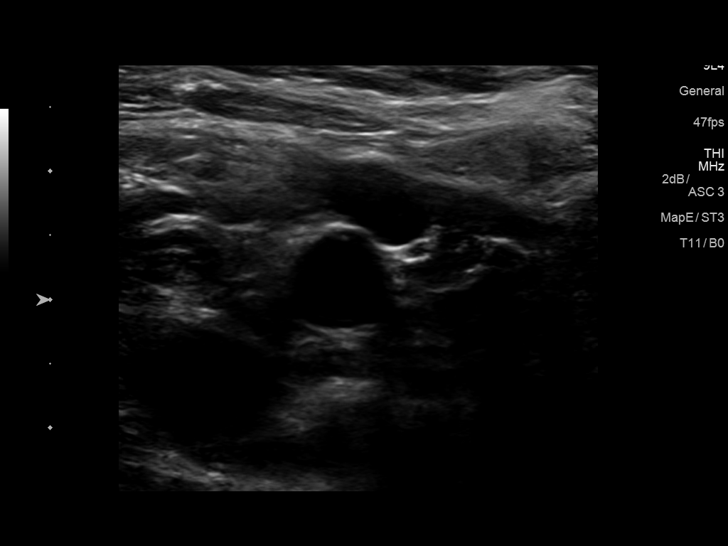
[im 4/46]
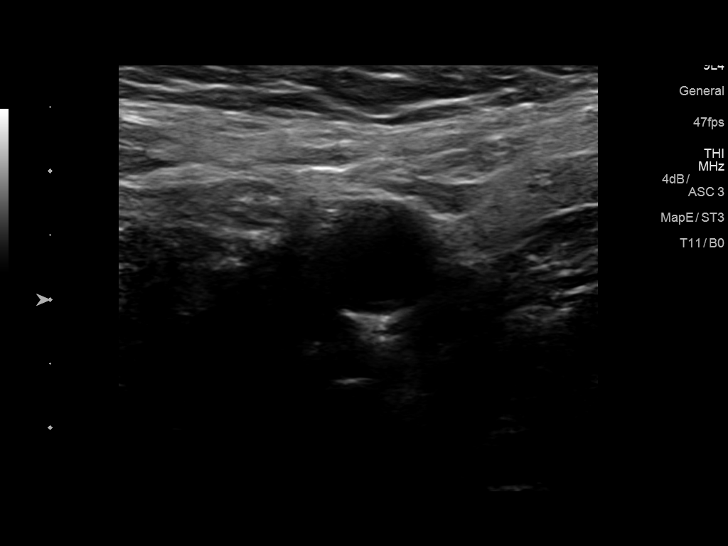
[im 8/46]
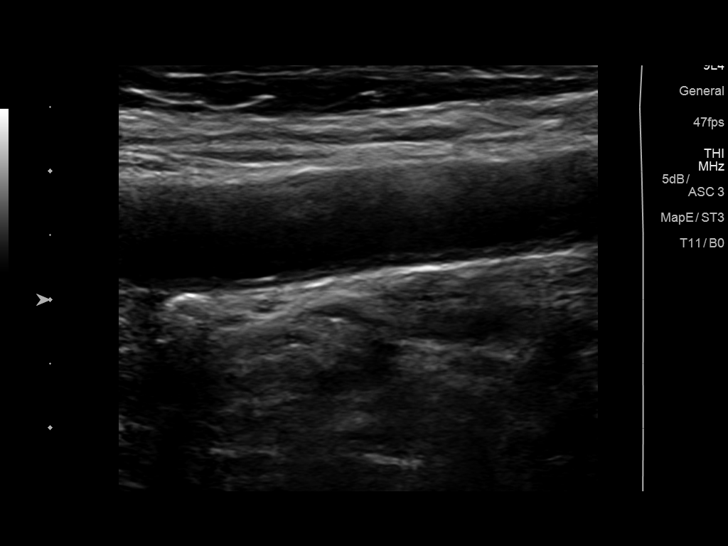
[im 12/46]
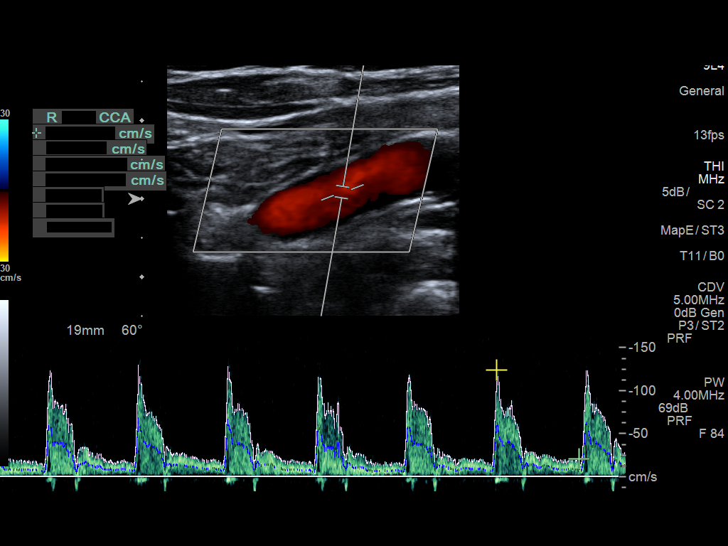
[im 16/46]
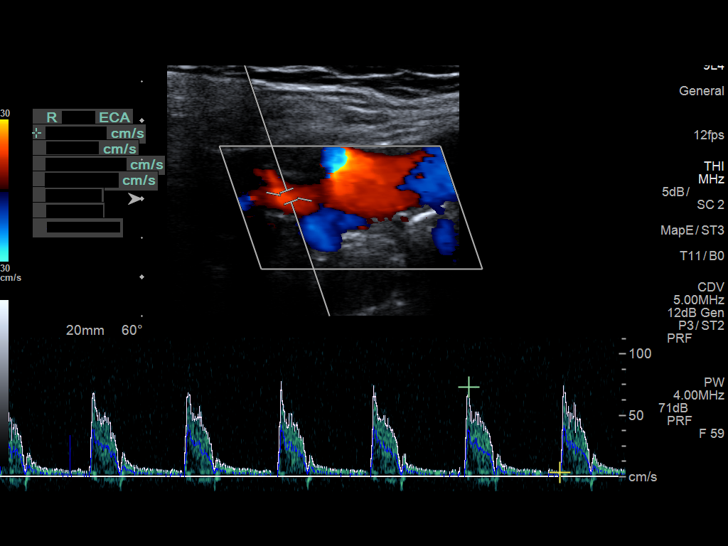
[im 20/46]
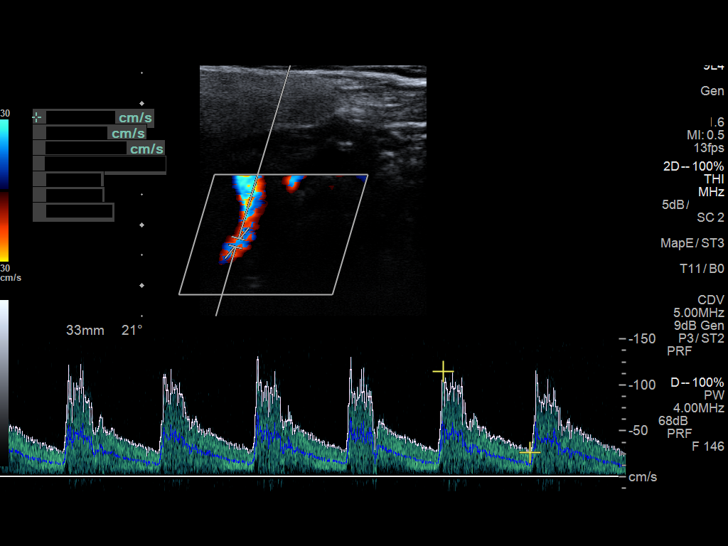
[im 24/46]
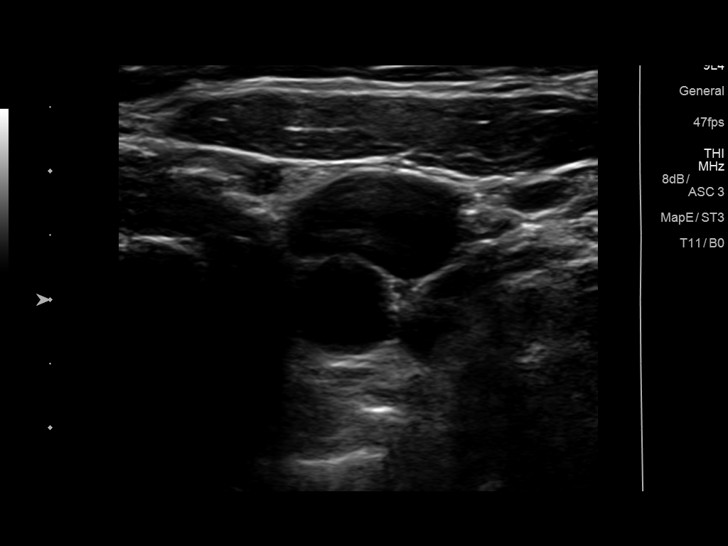
[im 26/46]
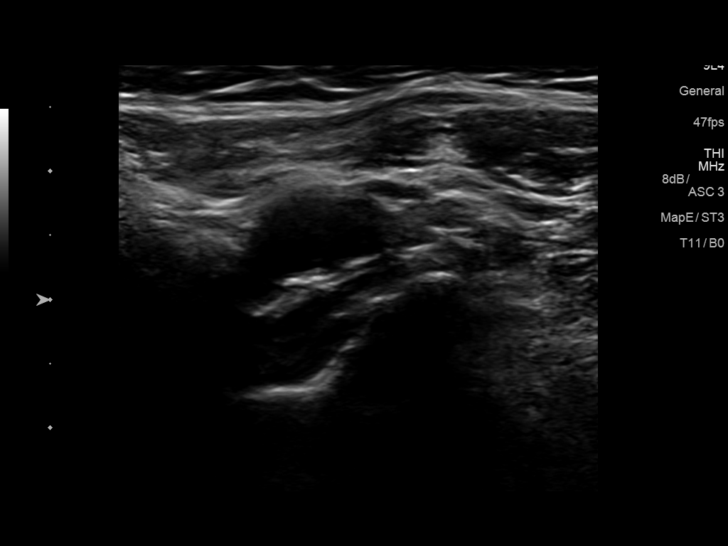
[im 30/46]
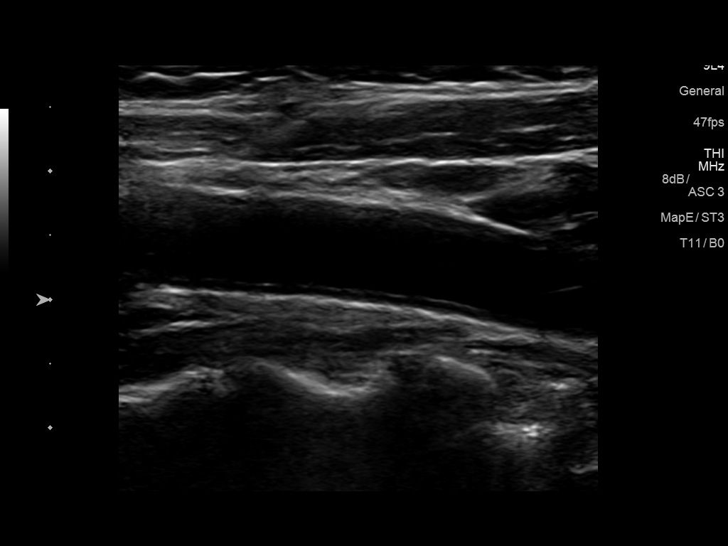
[im 34/46]
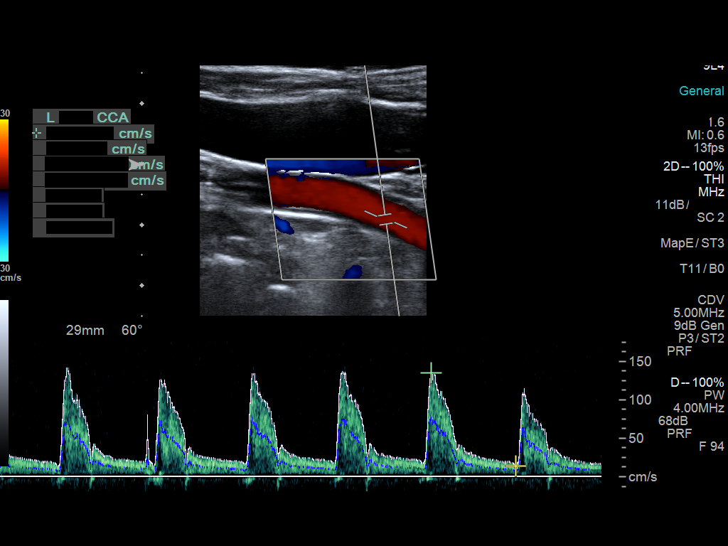
[im 38/46]
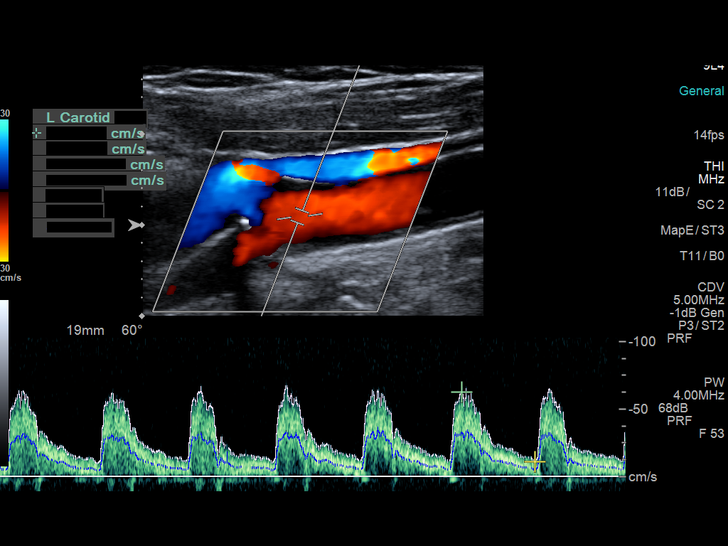
[im 42/46]
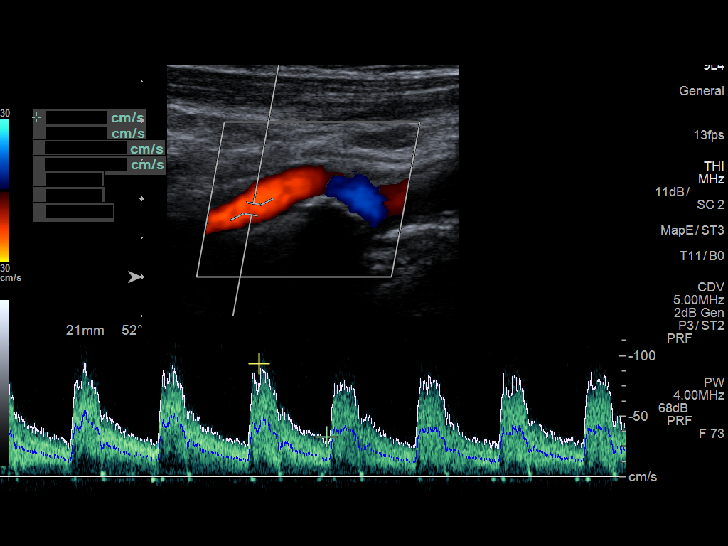
[im 46/46]
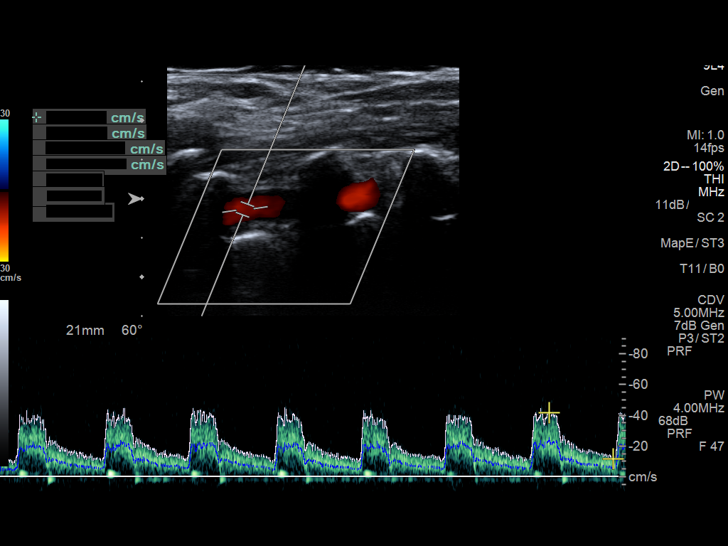

[13 of 24 positions shown; findings below may reference images not displayed]

FINDINGS: Criteria: Quantification of carotid stenosis is based on velocity
parameters that correlate the residual internal carotid diameter
with NASCET-based stenosis levels, using the diameter of the distal
internal carotid lumen as the denominator for stenosis measurement.

The following velocity measurements were obtained:

RIGHT

ICA:  145/35 cm/sec

CCA:  124/21 cm/sec

SYSTOLIC ICA/CCA RATIO:

DIASTOLIC ICA/CCA RATIO:

ECA:  73 cm/sec

LEFT

ICA:  119/38 cm/sec

CCA:  135/14 cm/sec

SYSTOLIC ICA/CCA RATIO:

DIASTOLIC ICA/CCA RATIO:

ECA:  85 cm/sec

RIGHT CAROTID ARTERY: Stable relatively mild amount of partially
calcified plaque at the level of the carotid bulb and proximal right
ICA. Based on strict velocity criteria, estimated right ICA stenosis
is 50- 69%. Stenosis is likely closer to 50% based on grayscale
imaging and tortuosity present which likely causes some velocity
elevation.

RIGHT VERTEBRAL ARTERY: Antegrade flow with normal waveform and
velocity.

LEFT CAROTID ARTERY: Similar and stable fairly mild amount of
partially calcified plaque at the level of the carotid bulb and
proximal left ICA. Estimated left ICA stenosis is less than 50%
based on velocities.

LEFT VERTEBRAL ARTERY: Antegrade flow with normal waveform and
velocity.
IMPRESSION: 1. Stable mild calcified plaque at the level of the proximal right
internal carotid artery. Based on velocity criteria, strict
categorization of stenosis is 50- 69%. Stenosis is felt to be likely
closer to 50% as there is some tortuosity throughout the right ICA
that would account for some of the velocity elevation.
2. Stable plaque at the left carotid bifurcation with estimated left
ICA stenosis of less than 50%.
